# Patient Record
Sex: Female | Born: 1990 | Race: Black or African American | Hispanic: No | Marital: Single | Smoking: Never smoker
Health system: Southern US, Community
[De-identification: ages and names within clinical notes are randomized; demographics above are authoritative.]

## PROBLEM LIST (undated history)

## (undated) ENCOUNTER — Inpatient Hospital Stay (HOSPITAL_COMMUNITY): Payer: Self-pay

## (undated) DIAGNOSIS — A749 Chlamydial infection, unspecified: Secondary | ICD-10-CM

---
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
## 2012-07-26 ENCOUNTER — Emergency Department (HOSPITAL_COMMUNITY)
Admission: EM | Admit: 2012-07-26 | Discharge: 2012-07-26 | Disposition: A | Discharge status: Home / Self Care | Payer: No Typology Code available for payment source | Attending: Emergency Medicine | Admitting: Emergency Medicine

## 2012-07-26 ENCOUNTER — Emergency Department (HOSPITAL_COMMUNITY): Payer: No Typology Code available for payment source

## 2012-07-26 DIAGNOSIS — S6990XA Unspecified injury of unspecified wrist, hand and finger(s), initial encounter: Secondary | ICD-10-CM | POA: Insufficient documentation

## 2012-07-26 DIAGNOSIS — Y9389 Activity, other specified: Secondary | ICD-10-CM | POA: Insufficient documentation

## 2012-07-26 DIAGNOSIS — S59909A Unspecified injury of unspecified elbow, initial encounter: Secondary | ICD-10-CM | POA: Insufficient documentation

## 2012-07-26 DIAGNOSIS — Y92410 Unspecified street and highway as the place of occurrence of the external cause: Secondary | ICD-10-CM | POA: Insufficient documentation

## 2012-07-26 MED ORDER — DIAZEPAM 5 MG PO TABS: 5.0000 mg | ORAL_TABLET | Freq: Two times a day (BID) | ORAL | Status: DC

## 2012-07-26 MED ORDER — HYDROCODONE-ACETAMINOPHEN 5-325 MG PO TABS: 1.0000 | ORAL_TABLET | Freq: Four times a day (QID) | ORAL | Status: DC | PRN

## 2012-07-26 MED ORDER — NAPROXEN 500 MG PO TABS: 500.0000 mg | ORAL_TABLET | Freq: Two times a day (BID) | ORAL | Status: DC
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
## 2012-07-26 NOTE — ED Notes (Signed)
Ortho tech called for wrist splint.  
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
## 2012-08-02 NOTE — ED Provider Notes (Signed)
Medical screening examination/treatment/procedure(s) were performed by non-physician practitioner and as supervising physician I was immediately available for consultation/collaboration.   Hurman Horn, MD 08/02/12 2127

## 2012-08-15 ENCOUNTER — Emergency Department (HOSPITAL_COMMUNITY)
Admission: EM | Admit: 2012-08-15 | Discharge: 2012-08-15 | Disposition: A | Discharge status: Home / Self Care | Payer: No Typology Code available for payment source | Attending: Emergency Medicine | Admitting: Emergency Medicine

## 2012-08-15 ENCOUNTER — Encounter (HOSPITAL_COMMUNITY): Payer: Self-pay | Admitting: Emergency Medicine

## 2012-08-15 DIAGNOSIS — Z3202 Encounter for pregnancy test, result negative: Secondary | ICD-10-CM | POA: Insufficient documentation

## 2012-08-15 DIAGNOSIS — Z975 Presence of (intrauterine) contraceptive device: Secondary | ICD-10-CM | POA: Insufficient documentation

## 2012-08-15 DIAGNOSIS — N949 Unspecified condition associated with female genital organs and menstrual cycle: Secondary | ICD-10-CM | POA: Insufficient documentation

## 2012-08-15 DIAGNOSIS — N898 Other specified noninflammatory disorders of vagina: Secondary | ICD-10-CM | POA: Insufficient documentation

## 2012-08-15 DIAGNOSIS — N938 Other specified abnormal uterine and vaginal bleeding: Secondary | ICD-10-CM | POA: Insufficient documentation

## 2012-08-15 LAB — WET PREP, GENITAL: Yeast Wet Prep HPF POC: NONE SEEN

## 2012-08-15 LAB — URINALYSIS, ROUTINE W REFLEX MICROSCOPIC
Leukocytes, UA: NEGATIVE
Nitrite: NEGATIVE
Specific Gravity, Urine: 1.007 (ref 1.005–1.030)
pH: 7.5 (ref 5.0–8.0)

## 2012-08-15 NOTE — Progress Notes (Signed)
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

## 2012-08-15 NOTE — ED Provider Notes (Signed)
History     CSN: [id number]  Arrival date & time 08/15/12  1051   First MD Initiated Contact with Patient 08/15/12 1157      Chief Complaint  Patient presents with  . Vaginal Bleeding    (Consider location/radiation/quality/duration/timing/severity/associated sxs/prior treatment) HPI Patient has had slight amount of vaginal bleeding, using pressor 1 penicillin are per day for the past 3 days. She denies abdominal pain denies pelvic pain. Denies a normal discharge denies fever Patient had IUD placed 07/12/2012. History reviewed. No pertinent past medical history. Past medical history negative History reviewed. No pertinent past surgical history.  No family history on file.  History  Substance Use Topics  . Smoking status: Not on file  . Smokeless tobacco: Not on file  . Alcohol Use: Not on file   No tobacco occasional alcohol no drug use OB History    Grav Para Term Preterm Abortions TAB SAB Ect Mult Living                  Review of Systems  Constitutional: Negative.   HENT: Negative.   Respiratory: Negative.   Cardiovascular: Negative.   Gastrointestinal: Negative.   Genitourinary: Positive for vaginal bleeding.  Musculoskeletal: Negative.   Skin: Negative.   Neurological: Negative.   Hematological: Negative.   Psychiatric/Behavioral: Negative.   All other systems reviewed and are negative.    Allergies  Review of patient's allergies indicates no known allergies.  Home Medications  No current outpatient prescriptions on file.  BP 120/70  Pulse 75  Temp 98.4 F (36.9 C) (Oral)  Resp 20  SpO2 100%  LMP 07/25/2012  Physical Exam  Nursing note and vitals reviewed. Constitutional: She appears well-developed and well-nourished.  HENT:  Head: Normocephalic and atraumatic.  Eyes: Conjunctivae normal are normal. Pupils are equal, round, and reactive to light.  Neck: Neck supple. No tracheal deviation present. No thyromegaly present.  Cardiovascular:  Normal rate and regular rhythm.   No murmur heard. Pulmonary/Chest: Effort normal and breath sounds normal.  Abdominal: Soft. Bowel sounds are normal. She exhibits no distension. There is no tenderness.  Genitourinary:       No external lesion white string coming from cervical os. Cervical os closed, slight amount yellowish vaginal discharge. No adnexal tenderness no mass no cervical motion tenderness  Musculoskeletal: Normal range of motion. She exhibits no edema and no tenderness.  Neurological: She is alert. Coordination normal.  Skin: Skin is warm and dry. No rash noted.  Psychiatric: She has a normal mood and affect.    ED Course  Procedures (including critical care time)   Labs Reviewed  URINALYSIS, ROUTINE W REFLEX MICROSCOPIC  GC/CHLAMYDIA PROBE AMP  WET PREP, GENITAL   No results found.   No diagnosis found.   2:10 PM patient feels well  MDM  Plan GC Chlamydia cultures pending. She is to keep his scheduled [facility]. Diagnosis dysfunctional uterine bleeding        Doug Sou, MD 08/15/12 1414

## 2012-08-15 NOTE — ED Notes (Signed)
States that she wants to make sure everything is ok with her IUD. States that she started having vaginal bleeding today.

## 2012-08-16 LAB — GC/CHLAMYDIA PROBE AMP
CT Probe RNA: NEGATIVE
GC Probe RNA: NEGATIVE

## 2013-04-11 ENCOUNTER — Encounter (HOSPITAL_COMMUNITY): Payer: Self-pay | Admitting: *Deleted

## 2013-04-11 ENCOUNTER — Emergency Department (HOSPITAL_COMMUNITY)
Admission: EM | Admit: 2013-04-11 | Discharge: 2013-04-11 | Disposition: A | Discharge status: Home / Self Care | Payer: No Typology Code available for payment source | Attending: Emergency Medicine | Admitting: Emergency Medicine

## 2013-04-11 DIAGNOSIS — Z79899 Other long term (current) drug therapy: Secondary | ICD-10-CM | POA: Insufficient documentation

## 2013-04-11 DIAGNOSIS — N898 Other specified noninflammatory disorders of vagina: Secondary | ICD-10-CM | POA: Insufficient documentation

## 2013-04-11 DIAGNOSIS — B3731 Acute candidiasis of vulva and vagina: Secondary | ICD-10-CM | POA: Insufficient documentation

## 2013-04-11 DIAGNOSIS — B373 Candidiasis of vulva and vagina: Secondary | ICD-10-CM

## 2013-04-11 DIAGNOSIS — Z3202 Encounter for pregnancy test, result negative: Secondary | ICD-10-CM | POA: Insufficient documentation

## 2013-04-11 LAB — WET PREP, GENITAL: Trich, Wet Prep: NONE SEEN

## 2013-04-11 MED ORDER — FLUCONAZOLE 150 MG PO TABS: 150.0000 mg | ORAL_TABLET | Freq: Once | ORAL | Status: DC

## 2013-04-11 NOTE — ED Notes (Signed)
Pt. Reported she has symptoms of a yeast infection that started on Wed. With white discharge from vagina and itching and redness also reported

## 2013-04-11 NOTE — ED Notes (Signed)
Pelvic cart set up and waiting for pelvic.

## 2013-04-11 NOTE — ED Provider Notes (Signed)
CSN: [id number]     Arrival date & time 04/11/13  1032 History  This chart was scribed for non-physician practitioner Magnus Sinning, PA-C working with Loren Racer, MD by Danella Maiers, ED Scribe. This patient was seen in room TR11C/TR11C and the patient's care was started at 12:32 PM.   Chief Complaint  Patient presents with  . Vaginitis   The history is provided by the patient. No language interpreter was used.   HPI Comments: Mariah Lin is a 22 y.o. female who presents to the Emergency Department complaining of a possible yeast infection onset 2 days ago. She reports white vaginal discharge that was thick initially but is watery today. She reports associated itching, redness, and swelling to the external vaginal area. Prior to the yeast infection she tried using a vaginal odor cream that she feels may have led to the yeast infection.  She has had a yeast infection before and reports the symptoms feel the same. She is sexually active. Her LMP was August 31. She denies abdominal pain or pelvic pain.  History reviewed. No pertinent past medical history. History reviewed. No pertinent past surgical history. No family history on file. History  Substance Use Topics  . Smoking status: Never Smoker   . Smokeless tobacco: Not on file  . Alcohol Use: No   OB History   Grav Para Term Preterm Abortions TAB SAB Ect Mult Living                 Review of Systems  Gastrointestinal: Negative for abdominal pain.  Genitourinary: Positive for vaginal discharge.  All other systems reviewed and are negative.    Allergies  Review of patient's allergies indicates no known allergies.  Home Medications   Current Outpatient Rx  Name  Route  Sig  Dispense  Refill  . Multiple Vitamins-Minerals (MULTIVITAMIN PO)   Oral   Take 1 tablet by mouth daily.         . naproxen sodium (ANAPROX) 220 MG tablet   Oral   Take 440 mg by mouth 2 (two) times daily as needed (cramps, pain).           BP 108/60  Pulse 65  Temp(Src) 98.6 F (37 C) (Oral)  Resp 20  Ht 5\' 2"  (1.575 m)  Wt 140 lb (63.504 kg)  BMI 25.6 kg/m2  SpO2 98%  LMP 03/16/2013 Physical Exam  Nursing note and vitals reviewed. Constitutional: She appears well-developed and well-nourished.  HENT:  Head: Normocephalic and atraumatic.  Mouth/Throat: Oropharynx is clear and moist.  Eyes: EOM are normal. Pupils are equal, round, and reactive to light.  Neck: Normal range of motion. Neck supple.  Cardiovascular: Normal rate, regular rhythm and normal heart sounds.   Pulmonary/Chest: Effort normal and breath sounds normal. She has no wheezes.  Abdominal: Soft. There is no tenderness.  Genitourinary: Cervix exhibits no motion tenderness. Right adnexum displays no mass, no tenderness and no fullness. Left adnexum displays no mass, no tenderness and no fullness.  Thick whitish discharge of the labia and the vaginal vault.  Musculoskeletal: Normal range of motion.  Neurological: She is alert.  Skin: Skin is warm and dry.  Psychiatric: She has a normal mood and affect. Her behavior is normal.    ED Course  Procedures (including critical care time) Medications - No data to display  DIAGNOSTIC STUDIES: Oxygen Saturation is 98% on room air, normal by my interpretation.    COORDINATION OF CARE: 12:48 PM- Discussed treatment plan with  pt and pt agrees to plan.    Labs Review Labs Reviewed - No data to display Imaging Review No results found.  MDM  No diagnosis found. Patient presenting with external vaginal itching and vaginal discharge.  Wet prep showing many yeast.  No pain with pelvic exam.  Urine pregnancy negative.  Patient treated with Diflucan.  Return precautions given.  I personally performed the services described in this documentation, which was scribed in my presence. The recorded information has been reviewed and is accurate.   Pascal Lux [place], PA-C 04/11/13 1520

## 2013-04-13 NOTE — ED Provider Notes (Signed)
Medical screening examination/treatment/procedure(s) were performed by non-physician practitioner and as supervising physician I was immediately available for consultation/collaboration.   Bernhardt Riemenschneider, MD 04/13/13 1503 

## 2014-05-21 ENCOUNTER — Inpatient Hospital Stay (HOSPITAL_COMMUNITY): Payer: BC Managed Care – PPO

## 2014-05-21 ENCOUNTER — Encounter (HOSPITAL_COMMUNITY): Payer: Self-pay

## 2014-05-21 ENCOUNTER — Inpatient Hospital Stay (HOSPITAL_COMMUNITY)
Admission: AD | Admit: 2014-05-21 | Discharge: 2014-05-21 | Disposition: A | Discharge status: Home / Self Care | Payer: BC Managed Care – PPO | Source: Ambulatory Visit | Attending: Obstetrics and Gynecology | Admitting: Obstetrics and Gynecology

## 2014-05-21 DIAGNOSIS — Z3A01 Less than 8 weeks gestation of pregnancy: Secondary | ICD-10-CM | POA: Diagnosis not present

## 2014-05-21 DIAGNOSIS — O2631 Retained intrauterine contraceptive device in pregnancy, first trimester: Secondary | ICD-10-CM | POA: Diagnosis present

## 2014-05-21 HISTORY — DX: Chlamydial infection, unspecified: A74.9

## 2014-05-21 LAB — CBC
HCT: 35.6 % — ABNORMAL LOW (ref 36.0–46.0)
Hemoglobin: 12.1 g/dL (ref 12.0–15.0)
MCH: 32.2 pg (ref 26.0–34.0)
MCHC: 34 g/dL (ref 30.0–36.0)
MCV: 94.7 fL (ref 78.0–100.0)
PLATELETS: 268 10*3/uL (ref 150–400)
RBC: 3.76 MIL/uL — AB (ref 3.87–5.11)
RDW: 12.2 % (ref 11.5–15.5)
WBC: 8.8 10*3/uL (ref 4.0–10.5)

## 2014-05-21 LAB — ABO/RH: ABO/RH(D): B POS

## 2014-05-21 LAB — WET PREP, GENITAL
Trich, Wet Prep: NONE SEEN
Yeast Wet Prep HPF POC: NONE SEEN

## 2014-05-21 LAB — URINALYSIS, ROUTINE W REFLEX MICROSCOPIC
BILIRUBIN URINE: NEGATIVE
Glucose, UA: NEGATIVE mg/dL
Hgb urine dipstick: NEGATIVE
KETONES UR: NEGATIVE mg/dL
Leukocytes, UA: NEGATIVE
NITRITE: NEGATIVE
Protein, ur: NEGATIVE mg/dL
Specific Gravity, Urine: 1.01 (ref 1.005–1.030)
UROBILINOGEN UA: 0.2 mg/dL (ref 0.0–1.0)
pH: 6 (ref 5.0–8.0)

## 2014-05-21 LAB — HCG, QUANTITATIVE, PREGNANCY: hCG, Beta Chain, Quant, S: 55913 m[IU]/mL — ABNORMAL HIGH (ref <5)

## 2014-05-21 LAB — POCT PREGNANCY, URINE: Preg Test, Ur: POSITIVE — AB

## 2014-05-21 NOTE — MAU Provider Note (Signed)
History     CSN: [id number]  Arrival date and time: 05/21/14 1244   First Provider Initiated Contact with Patient 05/21/14 1530      No chief complaint on file.  HPI  Mariah Lin is a 23 y.o. G1P0 at 1640w3d who was sent [facility]. She has a paragard IUD, and her period was 2 weeks late. She had a +UPT at the health clinic. She denies any pain or bleeding today. She is unsure if she wishes to keep the pregnancy or have an abortion at this time.   Past Medical History  Diagnosis Date  . Chlamydia 2012    History reviewed. No pertinent past surgical history.  Family History  Problem Relation Age of Onset  . Lupus Mother   . Asthma Mother   . Diabetes Father   . Asthma Father   . Asthma Brother     History  Substance Use Topics  . Smoking status: Never Smoker   . Smokeless tobacco: Not on file  . Alcohol Use: No    Allergies: No Known Allergies  Prescriptions prior to admission  Medication Sig Dispense Refill Last Dose  . Multiple Vitamin (MULTIVITAMIN WITH MINERALS) TABS tablet Take 1 tablet by mouth daily.   Past Week at Unknown time    ROS Physical Exam   Blood pressure 109/58, pulse 66, temperature 97.9 F (36.6 C), temperature source Oral, resp. rate 18, height 5' 3.8" (1.621 m), weight 64.003 kg (141 lb 1.6 oz), last menstrual period 04/06/2014, SpO2 100 %.  Physical Exam  Nursing note and vitals reviewed. Constitutional: She is oriented to person, place, and time. She appears well-developed and well-nourished. No distress.  Cardiovascular: Normal rate.   Respiratory: Effort normal.  GI: Soft. There is no tenderness. There is no rebound.  Genitourinary:   External: no lesion Vagina: small amount of white discharge Cervix: pink, smooth, no CMT, unable to see IUD tail  Uterus: NSSC Adnexa: NT   Neurological: She is alert and oriented to person, place, and time.  Skin: Skin is warm and dry.  Psychiatric: She has a normal mood  and affect.    MAU Course  Procedures   Results for orders placed or performed during the hospital encounter of 05/21/14 (from the past 24 hour(s))  Urinalysis, Routine w reflex microscopic     Status: None   Collection Time: 05/21/14 12:50 PM  Result Value Ref Range   Color, Urine YELLOW YELLOW   APPearance CLEAR CLEAR   Specific Gravity, Urine 1.010 1.005 - 1.030   pH 6.0 5.0 - 8.0   Glucose, UA NEGATIVE NEGATIVE mg/dL   Hgb urine dipstick NEGATIVE NEGATIVE   Bilirubin Urine NEGATIVE NEGATIVE   Ketones, ur NEGATIVE NEGATIVE mg/dL   Protein, ur NEGATIVE NEGATIVE mg/dL   Urobilinogen, UA 0.2 0.0 - 1.0 mg/dL   Nitrite NEGATIVE NEGATIVE   Leukocytes, UA NEGATIVE NEGATIVE  Pregnancy, urine POC     Status: Abnormal   Collection Time: 05/21/14 12:56 PM  Result Value Ref Range   Preg Test, Ur POSITIVE (A) NEGATIVE  CBC     Status: Abnormal   Collection Time: 05/21/14  1:11 PM  Result Value Ref Range   WBC 8.8 4.0 - 10.5 K/uL   RBC 3.76 (L) 3.87 - 5.11 MIL/uL   Hemoglobin 12.1 12.0 - 15.0 g/dL   HCT 82.935.6 (L) 56.236.0 - 13.046.0 %   MCV 94.7 78.0 - 100.0 fL   MCH 32.2 26.0 - 34.0  pg   MCHC 34.0 30.0 - 36.0 g/dL   RDW 16.112.2 09.611.5 - 04.515.5 %   Platelets 268 150 - 400 K/uL  hCG, quantitative, pregnancy     Status: Abnormal   Collection Time: 05/21/14  1:11 PM  Result Value Ref Range   hCG, Beta Chain, Quant, S 55913 (H) <5 mIU/mL  ABO/Rh     Status: None (Preliminary result)   Collection Time: 05/21/14  1:12 PM  Result Value Ref Range   ABO/RH(D) B POS   Wet prep, genital     Status: Abnormal   Collection Time: 05/21/14  3:58 PM  Result Value Ref Range   Yeast Wet Prep HPF POC NONE SEEN NONE SEEN   Trich, Wet Prep NONE SEEN NONE SEEN   Clue Cells Wet Prep HPF POC MODERATE (A) NONE SEEN   WBC, Wet Prep HPF POC FEW (A) NONE SEEN     [place] Ob Comp Less 14 Wks  05/21/2014   CLINICAL DATA:  First trimester pregnancy with IUD in place.  EXAM: OBSTETRIC <14 WK US AND TRANSVAGINAL OB US   TECHNIQUE: Both transabdominal and transvaginal ultrasound examinations were performed for complete evaluation of the gestation as well as the maternal uterus, adnexal regions, and pelvic cul-de-sac. Transvaginal technique was performed to assess early pregnancy.  COMPARISON:  None.  FINDINGS: Intrauterine gestational sac: Visualized/normal in shape.  Yolk sac:  Visualized  Embryo:  Visualized  Cardiac Activity: Visualized  Heart Rate:  105 bpm  CRL:   6  mm   6 w 3 d                  US EDC: 01/11/2015  Maternal uterus/adnexae: IUD is seen within the endometrial cavity the which is up-side down in position. These side arms of the IUD are in the lower uterine segment and the stem of the IUD extends along the left lateral wall of the intrauterine gestational sac.  Both ovaries are normal in appearance. No adnexal mass free fluid identified.  IMPRESSION: Single living IUP measuring 6 weeks 3 days with US EDC of 01/11/2015.  Malpositioned IUD within the endometrial cavity, with side arms in the lower uterine segment, and stem of IUD extending along the left lateral wall of the gestational sac.   Electronically Signed   By: Myles RosenthalJohn  Stahl M.D.   On: 05/21/2014 14:46   [place] Ob Transvaginal  05/21/2014   CLINICAL DATA:  First trimester pregnancy with IUD in place.  EXAM: OBSTETRIC <14 WK US AND TRANSVAGINAL OB US  TECHNIQUE: Both transabdominal and transvaginal ultrasound examinations were performed for complete evaluation of the gestation as well as the maternal uterus, adnexal regions, and pelvic cul-de-sac. Transvaginal technique was performed to assess early pregnancy.  COMPARISON:  None.  FINDINGS: Intrauterine gestational sac: Visualized/normal in shape.  Yolk sac:  Visualized  Embryo:  Visualized  Cardiac Activity: Visualized  Heart Rate:  105 bpm  CRL:   6  mm   6 w 3 d                  US EDC: 01/11/2015  Maternal uterus/adnexae: IUD is seen within the endometrial cavity the which is up-side down in position. These  side arms of the IUD are in the lower uterine segment and the stem of the IUD extends along the left lateral wall of the intrauterine gestational sac.  Both ovaries are normal in appearance. No adnexal mass free fluid identified.  IMPRESSION: Single living IUP  measuring 6 weeks 3 days with [place] EDC of 01/11/2015.  Malpositioned IUD within the endometrial cavity, with side arms in the lower uterine segment, and stem of IUD extending along the left lateral wall of the gestational sac.   Electronically Signed   By: Myles Rosenthal M.D.   On: 05/21/2014 14:46    1543: D.W Dr. Jolayne Panther, cannot removed IUD today. Review risks of pregnancy with IUD with the patient. If she decides to terminate will likely need D&C over medical abortion.  Assessment and Plan   1. Pregnancy with IUD in place, antepartum, first trimester    Uncertain about abortion v continuing the pregnancy  Counseled to inform any practice of IUD in place, and unable to removed Return to MAU as needed First trimester precautions     Tawnya Crook 05/21/2014, 4:56 PM

## 2014-05-21 NOTE — MAU Note (Signed)
Patient was sent from her student health center after having a positive UPT and has had a IUD for about 2 years. Patient denies pain or bleeding.

## 2014-05-21 NOTE — Discharge Instructions (Signed)
First Trimester of Pregnancy The first trimester of pregnancy is from week 1 until the end of week 12 (months 1 through 3). A week after a sperm fertilizes an egg, the egg will implant on the wall of the uterus. This embryo will begin to develop into a baby. Genes from you and your partner are forming the baby. The female genes determine whether the baby is a boy or a girl. At 6-8 weeks, the eyes and face are formed, and the heartbeat can be seen on ultrasound. At the end of 12 weeks, all the baby's organs are formed.  Now that you are pregnant, you will want to do everything you can to have a healthy baby. Two of the most important things are to get good prenatal care and to follow your health care provider's instructions. Prenatal care is all the medical care you receive before the baby's birth. This care will help prevent, find, and treat any problems during the pregnancy and childbirth. BODY CHANGES Your body goes through many changes during pregnancy. The changes vary from woman to woman.   You may gain or lose a couple of pounds at first.  You may feel sick to your stomach (nauseous) and throw up (vomit). If the vomiting is uncontrollable, call your health care provider.  You may tire easily.  You may develop headaches that can be relieved by medicines approved by your health care provider.  You may urinate more often. Painful urination may mean you have a bladder infection.  You may develop heartburn as a result of your pregnancy.  You may develop constipation because certain hormones are causing the muscles that push waste through your intestines to slow down.  You may develop hemorrhoids or swollen, bulging veins (varicose veins).  Your breasts may begin to grow larger and become tender. Your nipples may stick out more, and the tissue that surrounds them (areola) may become darker.  Your gums may bleed and may be sensitive to brushing and flossing.  Dark spots or blotches (chloasma,  mask of pregnancy) may develop on your face. This will likely fade after the baby is born.  Your menstrual periods will stop.  You may have a loss of appetite.  You may develop cravings for certain kinds of food.  You may have changes in your emotions from day to day, such as being excited to be pregnant or being concerned that something may go wrong with the pregnancy and baby.  You may have more vivid and strange dreams.  You may have changes in your hair. These can include thickening of your hair, rapid growth, and changes in texture. Some women also have hair loss during or after pregnancy, or hair that feels dry or thin. Your hair will most likely return to normal after your baby is born. WHAT TO EXPECT AT YOUR PRENATAL VISITS During a routine prenatal visit:  You will be weighed to make sure you and the baby are growing normally.  Your blood pressure will be taken.  Your abdomen will be measured to track your baby's growth.  The fetal heartbeat will be listened to starting around week 10 or 12 of your pregnancy.  Test results from any previous visits will be discussed. Your health care provider may ask you:  How you are feeling.  If you are feeling the baby move.  If you have had any abnormal symptoms, such as leaking fluid, bleeding, severe headaches, or abdominal cramping.  If you have any questions. Other tests   that may be performed during your first trimester include:  Blood tests to find your blood type and to check for the presence of any previous infections. They will also be used to check for low iron levels (anemia) and Rh antibodies. Later in the pregnancy, blood tests for diabetes will be done along with other tests if problems develop.  Urine tests to check for infections, diabetes, or protein in the urine.  An ultrasound to confirm the proper growth and development of the baby.  An amniocentesis to check for possible genetic problems.  Fetal screens for  spina bifida and Down syndrome.  You may need other tests to make sure you and the baby are doing well. HOME CARE INSTRUCTIONS  Medicines  Follow your health care provider's instructions regarding medicine use. Specific medicines may be either safe or unsafe to take during pregnancy.  Take your prenatal vitamins as directed.  If you develop constipation, try taking a stool softener if your health care provider approves. Diet  Eat regular, well-balanced meals. Choose a variety of foods, such as meat or vegetable-based protein, fish, milk and low-fat dairy products, vegetables, fruits, and whole grain breads and cereals. Your health care provider will help you determine the amount of weight gain that is right for you.  Avoid raw meat and uncooked cheese. These carry germs that can cause birth defects in the baby.  Eating four or five small meals rather than three large meals a day may help relieve nausea and vomiting. If you start to feel nauseous, eating a few soda crackers can be helpful. Drinking liquids between meals instead of during meals also seems to help nausea and vomiting.  If you develop constipation, eat more high-fiber foods, such as fresh vegetables or fruit and whole grains. Drink enough fluids to keep your urine clear or pale yellow. Activity and Exercise  Exercise only as directed by your health care provider. Exercising will help you:  Control your weight.  Stay in shape.  Be prepared for labor and delivery.  Experiencing pain or cramping in the lower abdomen or low back is a good sign that you should stop exercising. Check with your health care provider before continuing normal exercises.  Try to avoid standing for long periods of time. Move your legs often if you must stand in one place for a long time.  Avoid heavy lifting.  Wear low-heeled shoes, and practice good posture.  You may continue to have sex unless your health care provider directs you  otherwise. Relief of Pain or Discomfort  Wear a good support bra for breast tenderness.   Take warm sitz baths to soothe any pain or discomfort caused by hemorrhoids. Use hemorrhoid cream if your health care provider approves.   Rest with your legs elevated if you have leg cramps or low back pain.  If you develop varicose veins in your legs, wear support hose. Elevate your feet for 15 minutes, 3-4 times a day. Limit salt in your diet. Prenatal Care  Schedule your prenatal visits by the twelfth week of pregnancy. They are usually scheduled monthly at first, then more often in the last 2 months before delivery.  Write down your questions. Take them to your prenatal visits.  Keep all your prenatal visits as directed by your health care provider. Safety  Wear your seat belt at all times when driving.  Make a list of emergency phone numbers, including numbers for family, friends, the hospital, and police and fire departments. General Tips    Ask your health care provider for a referral to a local prenatal education class. Begin classes no later than at the beginning of month 6 of your pregnancy.  Ask for help if you have counseling or nutritional needs during pregnancy. Your health care provider can offer advice or refer you to specialists for help with various needs.  Do not use hot tubs, steam rooms, or saunas.  Do not douche or use tampons or scented sanitary pads.  Do not cross your legs for long periods of time.  Avoid cat litter boxes and soil used by cats. These carry germs that can cause birth defects in the baby and possibly loss of the fetus by miscarriage or stillbirth.  Avoid all smoking, herbs, alcohol, and medicines not prescribed by your health care provider. Chemicals in these affect the formation and growth of the baby.  Schedule a dentist appointment. At home, brush your teeth with a soft toothbrush and be gentle when you floss. SEEK MEDICAL CARE IF:   You have  dizziness.  You have mild pelvic cramps, pelvic pressure, or nagging pain in the abdominal area.  You have persistent nausea, vomiting, or diarrhea.  You have a bad smelling vaginal discharge.  You have pain with urination.  You notice increased swelling in your face, hands, legs, or ankles. SEEK IMMEDIATE MEDICAL CARE IF:   You have a fever.  You are leaking fluid from your vagina.  You have spotting or bleeding from your vagina.  You have severe abdominal cramping or pain.  You have rapid weight gain or loss.  You vomit blood or material that looks like coffee grounds.  You are exposed to German measles and have never had them.  You are exposed to fifth disease or chickenpox.  You develop a severe headache.  You have shortness of breath.  You have any kind of trauma, such as from a fall or a car accident. Document Released: 06/27/2001 Document Revised: 11/17/2013 Document Reviewed: 05/13/2013 [employer] Patient Information 2015 [employer]. This information is not intended to replace advice given to you by your health care provider. Make sure you discuss any questions you have with your health care provider.  

## 2014-05-22 LAB — HIV ANTIBODY (ROUTINE TESTING W REFLEX): HIV 1&2 Ab, 4th Generation: NONREACTIVE

## 2014-05-22 LAB — GC/CHLAMYDIA PROBE AMP
CT Probe RNA: NEGATIVE
GC PROBE AMP APTIMA: NEGATIVE
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
## 2015-03-26 ENCOUNTER — Encounter (HOSPITAL_COMMUNITY): Payer: Self-pay | Admitting: *Deleted

## 2016-10-16 IMAGING — US US OB COMP LESS 14 WK
1 series · 13 of 28 positions shown · non-contrast
Comparison: None.

CLINICAL DATA: First trimester pregnancy with IUD in place.

EXAM:
OBSTETRIC <14 WK US AND TRANSVAGINAL OB US
TECHNIQUE: Both transabdominal and transvaginal ultrasound examinations were
performed for complete evaluation of the gestation as well as the
maternal uterus, adnexal regions, and pelvic cul-de-sac.
Transvaginal technique was performed to assess early pregnancy.

[Series 1: us ob comp less 14 wks · 41 acquisitions, 13 frames shown]
[im 2/41]
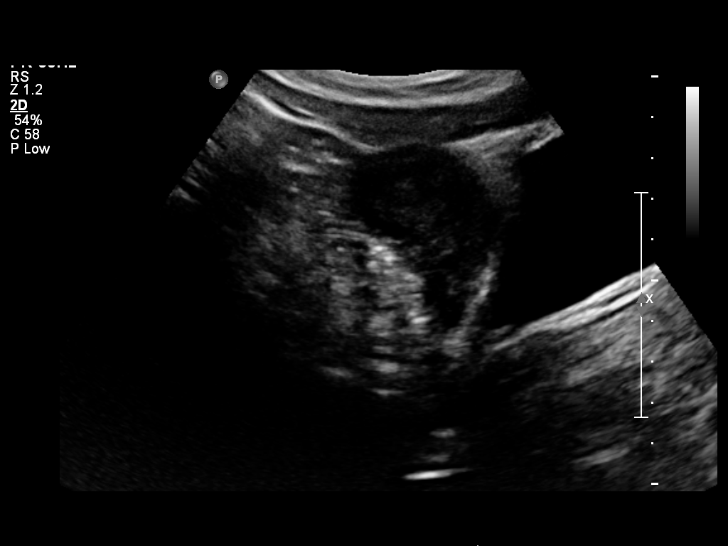
[im 5/41]
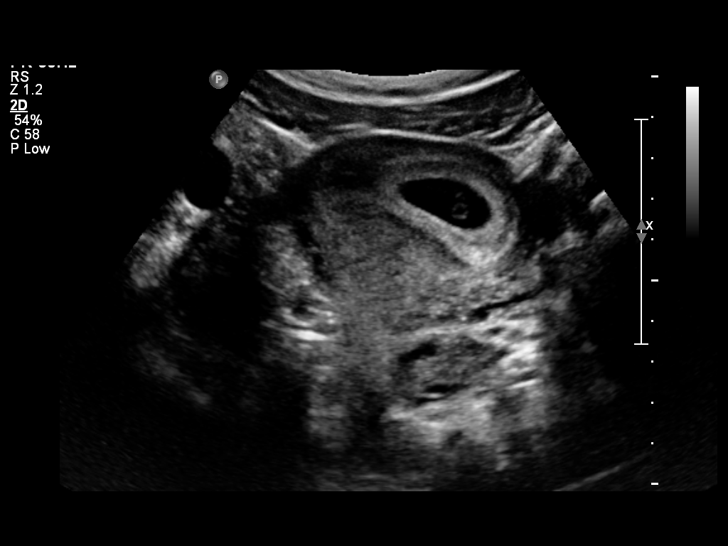
[im 8/41]
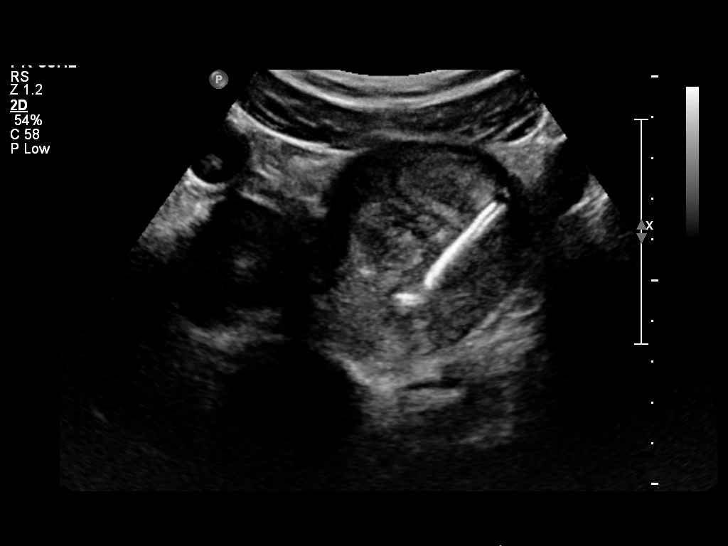
[im 11/41]
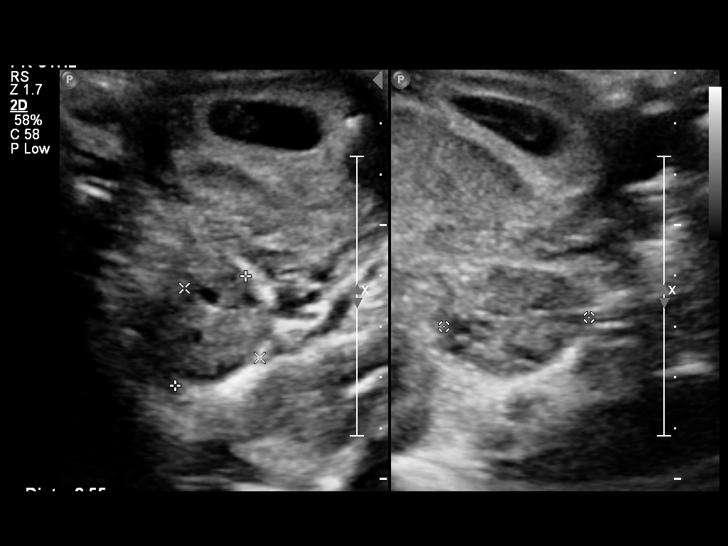
[im 14/41]
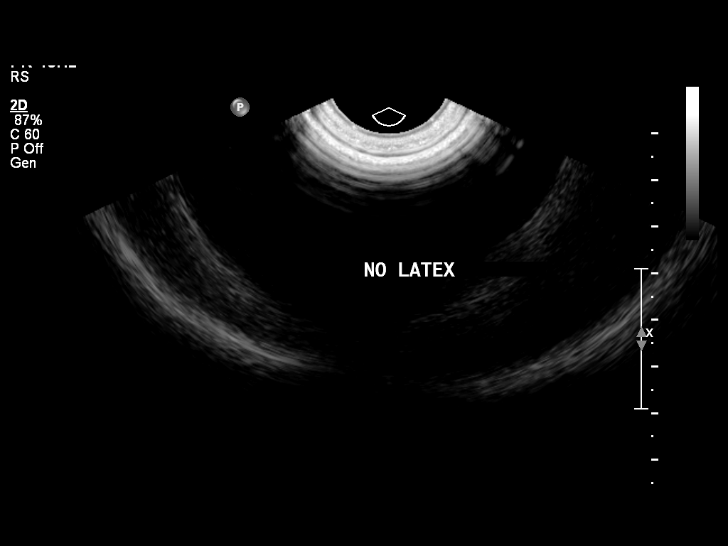
[im 17/41]
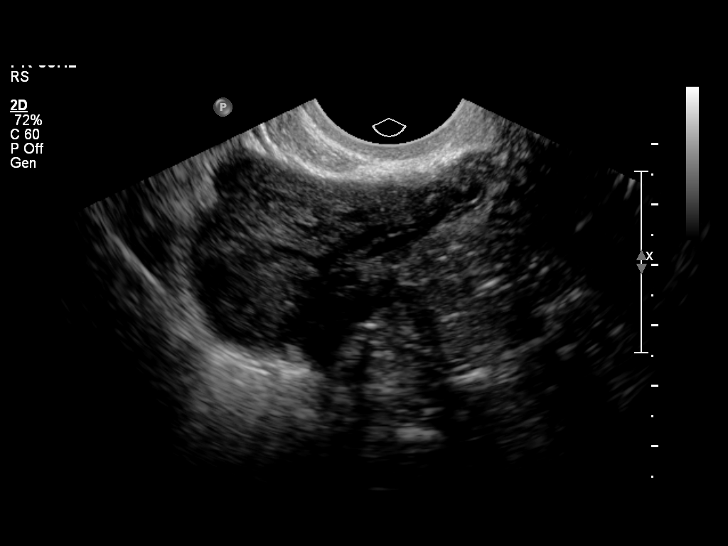
[im 21/41]
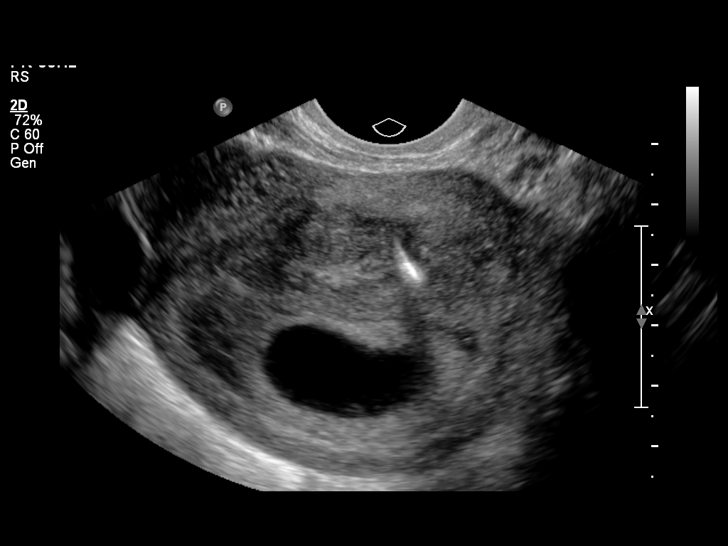
[im 24/41]
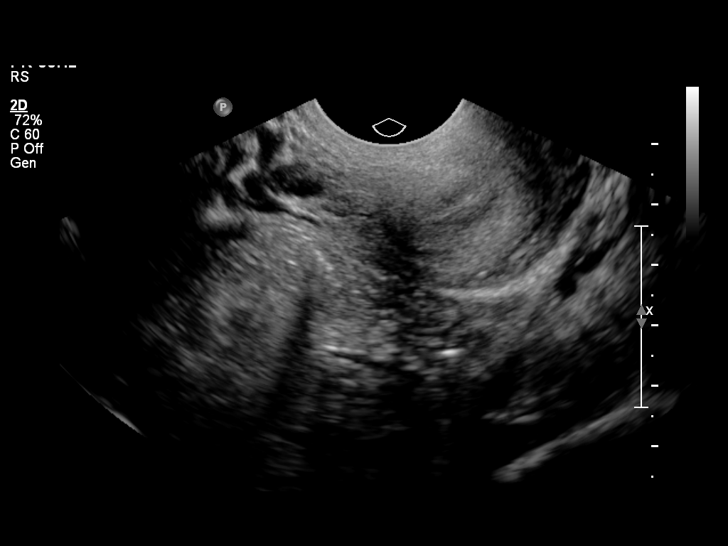
[im 27/41]
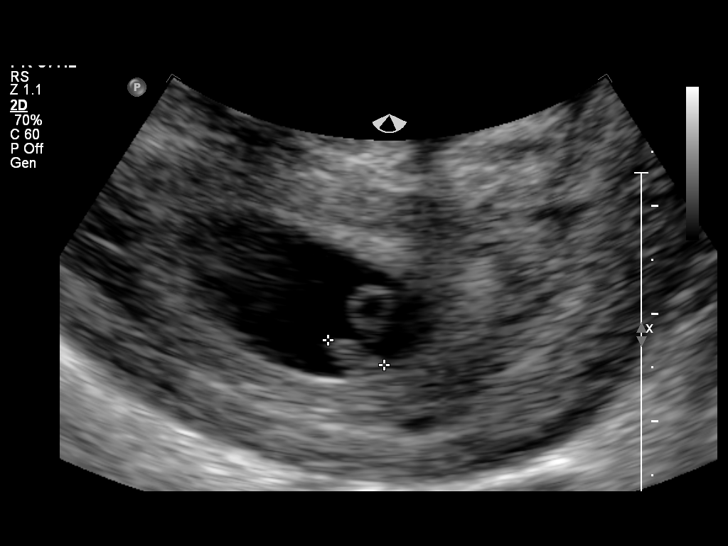
[im 30/41]
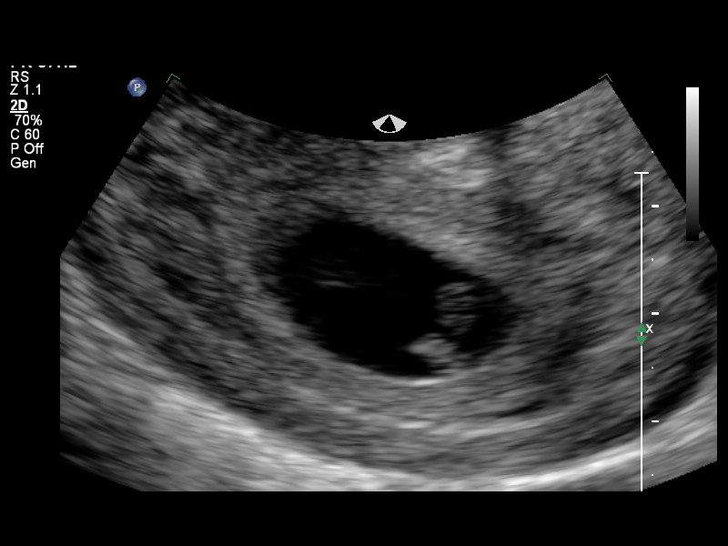
[im 33/41]
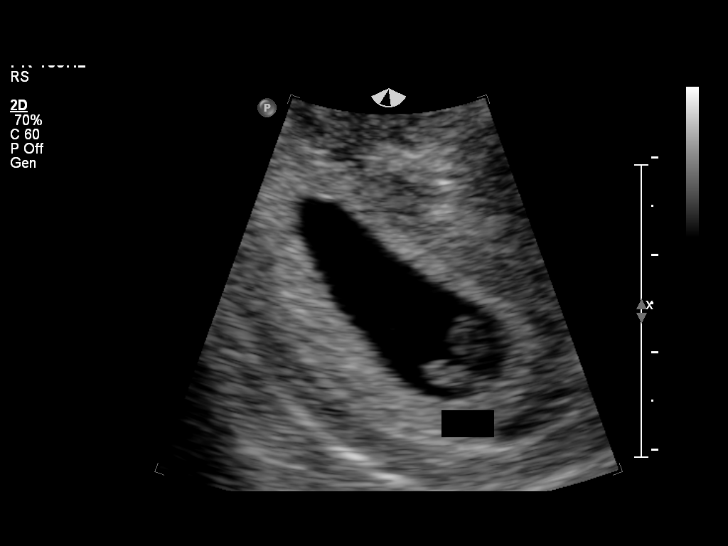
[im 36/41]
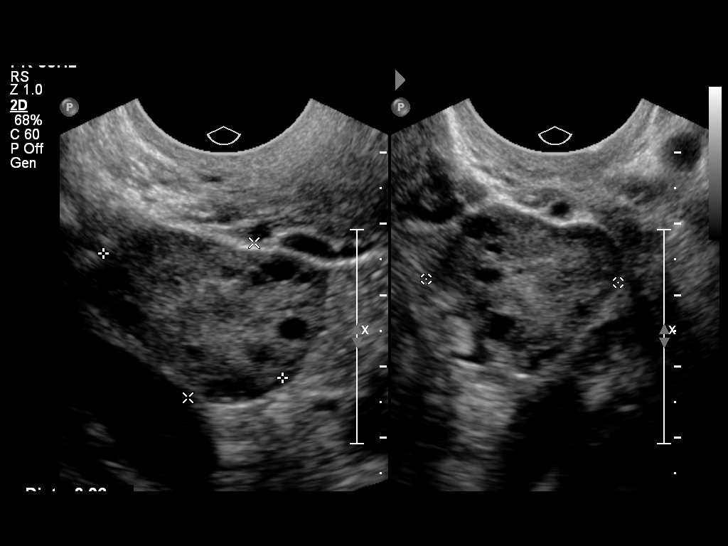
[im 39/41]
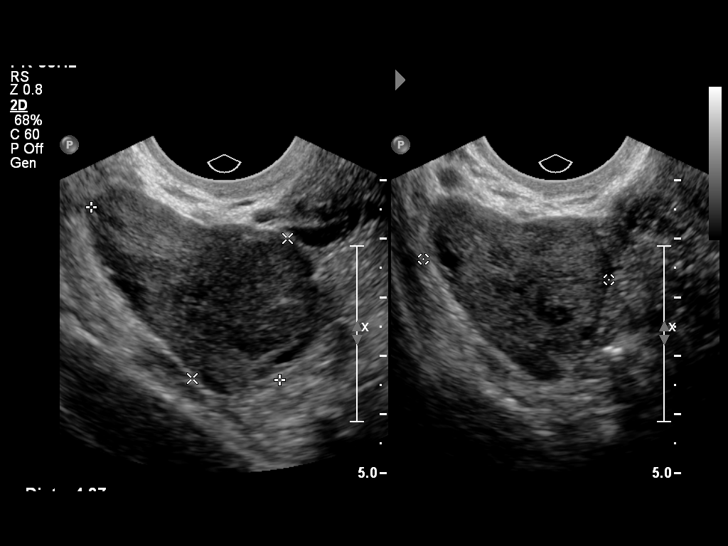

[13 of 28 positions shown; findings below may reference images not displayed]

FINDINGS: Intrauterine gestational sac: Visualized/normal in shape.

Yolk sac:  Visualized

Embryo:  Visualized

Cardiac Activity: Visualized

Heart Rate:  105 bpm

CRL:   6  mm   6 w 3 d                  US EDC: 01/11/2015

Maternal uterus/adnexae: IUD is seen within the endometrial cavity
the which is up-side down in position. These side arms of the IUD
are in the lower uterine segment and the stem of the IUD extends
along the left lateral wall of the intrauterine gestational sac.

Both ovaries are normal in appearance. No adnexal mass free fluid
identified.
IMPRESSION: Single living IUP measuring 6 weeks 3 days with US EDC of
01/11/2015.

Malpositioned IUD within the endometrial cavity, with side arms in
the lower uterine segment, and stem of IUD extending along the left
lateral wall of the gestational sac.
